# Patient Record
Sex: Male | Born: 1990 | Race: White | Hispanic: No | Marital: Married | Smoking: Never smoker
Health system: Southern US, Community
[De-identification: ages and names within clinical notes are randomized; demographics above are authoritative.]

## PROBLEM LIST (undated history)

## (undated) DIAGNOSIS — C9590 Leukemia, unspecified not having achieved remission: Secondary | ICD-10-CM

## (undated) HISTORY — PX: BACK SURGERY: SHX140

---
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
## 2011-01-16 ENCOUNTER — Emergency Department: Payer: Self-pay | Admitting: Unknown Physician Specialty

## 2011-09-11 ENCOUNTER — Emergency Department: Payer: Self-pay | Admitting: Emergency Medicine

## 2012-03-04 ENCOUNTER — Emergency Department: Payer: Self-pay | Admitting: Internal Medicine

## 2013-11-27 ENCOUNTER — Ambulatory Visit: Payer: Self-pay | Admitting: Emergency Medicine

## 2014-02-18 ENCOUNTER — Ambulatory Visit: Payer: Self-pay

## 2014-02-25 ENCOUNTER — Ambulatory Visit: Payer: Self-pay

## 2014-11-05 ENCOUNTER — Emergency Department: Payer: Self-pay | Admitting: Emergency Medicine

## 2014-12-02 ENCOUNTER — Ambulatory Visit: Payer: Self-pay | Admitting: Family Medicine

## 2015-01-22 ENCOUNTER — Ambulatory Visit: Payer: BLUE CROSS/BLUE SHIELD

## 2015-01-22 ENCOUNTER — Ambulatory Visit
Admission: EM | Admit: 2015-01-22 | Discharge: 2015-01-22 | Disposition: A | Discharge status: Home / Self Care | Payer: BLUE CROSS/BLUE SHIELD | Attending: Family Medicine | Admitting: Family Medicine

## 2015-01-22 DIAGNOSIS — X58XXXA Exposure to other specified factors, initial encounter: Secondary | ICD-10-CM | POA: Diagnosis not present

## 2015-01-22 DIAGNOSIS — M25579 Pain in unspecified ankle and joints of unspecified foot: Secondary | ICD-10-CM | POA: Diagnosis present

## 2015-01-22 DIAGNOSIS — S93402A Sprain of unspecified ligament of left ankle, initial encounter: Secondary | ICD-10-CM | POA: Diagnosis not present

## 2015-01-22 HISTORY — DX: Leukemia, unspecified not having achieved remission: C95.90

## 2015-01-22 MED ORDER — KETOROLAC TROMETHAMINE 60 MG/2ML IM SOLN
60.0000 mg | Freq: Once | INTRAMUSCULAR | Status: AC
  Administered 2015-01-22: 60 mg via INTRAMUSCULAR

## 2015-01-22 MED ORDER — ETODOLAC 500 MG PO TABS: 500.0000 mg | ORAL_TABLET | Freq: Two times a day (BID) | ORAL | Status: DC

## 2015-01-22 NOTE — ED Provider Notes (Signed)
CSN: [id number]     Arrival date & time 01/22/15  1346 History   First MD Initiated Contact with Patient 01/22/15 1456     Chief Complaint  Patient presents with  . Ankle Pain   (Consider location/radiation/quality/duration/timing/severity/associated sxs/prior Treatment) HPI       24 year old male presents for evaluation of foot and ankle pain. He was working in his yard on Monday when he stepped in a hole and rolled his foot and ankle over in an inversion type injury. He felt a pop around the lateral portion of his foot. He had some bruising and swelling, the swelling is mostly resolved. He has some pain immediately with any ambulation that worsens with continued ambulation. He denies any previous history of ankle sprains or lower leg fractures. No other past medical history. No recent travel or sick contacts. No numbness. No other injuries  Past Medical History  Diagnosis Date  . 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 No pertinent past surgical history. History reviewed. No pertinent family history. History  Substance Use Topics  . Smoking status: Never Smoker   . Smokeless tobacco: Not on file  . Alcohol Use: Yes     Comment: socially    Review of Systems  Musculoskeletal: Positive for joint swelling and arthralgias.       Left foot and ankle pain and swelling  All other systems reviewed and are negative.   Allergies  Review of patient's allergies indicates no known allergies.  Home Medications   Prior to Admission medications   Medication Sig Start Date End Date Taking? Authorizing Provider  ibuprofen (ADVIL,MOTRIN) 800 MG tablet Take 1,600 mg by mouth every 8 (eight) hours as needed.   Yes Historical Provider, MD  etodolac (LODINE) 500 MG tablet Take 1 tablet (500 mg total) by mouth 2 (two) times daily. 01/22/15   Freeman Caldron Kebron Pulse, PA-C   BP 152/98 mmHg  Pulse 100  Temp(Src) 96.3 F (35.7 C) (Tympanic)  Resp 16  Ht 6\' 4"  (1.93 m)  Wt 254 lb  (115.214 kg)  BMI 30.93 kg/m2  SpO2 98% Physical Exam  Constitutional: He is oriented to person, place, and time. He appears well-developed and well-nourished. No distress.  HENT:  Head: Normocephalic.  Cardiovascular:  Pulses:      Dorsalis pedis pulses are 2+ on the left side.  Pulmonary/Chest: Effort normal. No respiratory distress.  Musculoskeletal:       Left ankle: He exhibits normal range of motion, no swelling and no deformity. Tenderness. Lateral malleolus and medial malleolus tenderness found.       Left foot: There is tenderness (lateral foot and proximal dorsal portion of the foot towards the ankle), bony tenderness and swelling. There is normal range of motion, no crepitus and no deformity.  He endorses pain and tenderness everywhere, including the Achilles tendon, medial and lateral joint lines, proximal portions of foot, heel, through the arch of the foot. Objectively, the foot and ankle appear completely normal  Neurological: He is alert and oriented to person, place, and time. Coordination normal.  Skin: Skin is warm and dry. No rash noted. He is not diaphoretic.  Psychiatric: He has a normal mood and affect. Judgment normal.  Nursing note and vitals reviewed.   ED Course  Procedures (including critical care time) Labs Review Labs Reviewed - No data to display  Imaging Review Dg Ankle Complete Left  01/22/2015   CLINICAL DATA:  Ankle pain  EXAM:  LEFT ANKLE COMPLETE - 3+ VIEW  COMPARISON:  None.  FINDINGS: There is no evidence of fracture, dislocation, or joint effusion. There is no evidence of arthropathy or other focal bone abnormality. Soft tissues are unremarkable.  IMPRESSION: Negative.   Electronically Signed   By: Franchot Gallo M.D.   On: 01/22/2015 16:21   Dg Foot Complete Left  01/22/2015   CLINICAL DATA:  Acute left foot pain after injury in yard. Initial encounter.  EXAM: LEFT FOOT - COMPLETE 3+ VIEW  COMPARISON:  None.  FINDINGS: There is no evidence of  fracture or dislocation. There is no evidence of arthropathy or other focal bone abnormality. Soft tissues are unremarkable.  IMPRESSION: Normal left foot.   Electronically Signed   By: Marijo Conception, M.D.   On: 01/22/2015 15:14     MDM   1. Ankle sprain, left, initial encounter    Most likely an ankle sprain. He was placed in a stirrup ankle splint. Advised ice, elevation. Will prescribe an anti-inflammatory. Follow-up if no improvement in a week to repeat the x-rays. We discussed rehabilitation exercises as well.  Meds ordered this encounter  Medications         . ketorolac (TORADOL) injection 60 mg    Sig:   . etodolac (LODINE) 500 MG tablet    Sig: Take 1 tablet (500 mg total) by mouth 2 (two) times daily.    Dispense:  30 tablet    Refill:  [place] Marcy Bogosian, PA-C 01/22/15 1731  Liam Graham, PA-C 01/22/15 1731

## 2015-01-22 NOTE — Discharge Instructions (Signed)
Acute Ankle Sprain with Phase I Rehab An acute ankle sprain is a partial or complete tear in one or more of the ligaments of the ankle due to traumatic injury. The severity of the injury depends on both the number of ligaments sprained and the grade of sprain. There are 3 grades of sprains.   A grade 1 sprain is a mild sprain. There is a slight pull without obvious tearing. There is no loss of strength, and the muscle and ligament are the correct length.  A grade 2 sprain is a moderate sprain. There is tearing of fibers within the substance of the ligament where it connects two bones or two cartilages. The length of the ligament is increased, and there is usually decreased strength.  A grade 3 sprain is a complete rupture of the ligament and is uncommon. In addition to the grade of sprain, there are three types of ankle sprains.  Lateral ankle sprains: This is a sprain of one or more of the three ligaments on the outer side (lateral) of the ankle. These are the most common sprains. Medial ankle sprains: There is one large triangular ligament of the inner side (medial) of the ankle that is susceptible to injury. Medial ankle sprains are less common. Syndesmosis, "high ankle," sprains: The syndesmosis is the ligament that connects the two bones of the lower leg. Syndesmosis sprains usually only occur with very severe ankle sprains. SYMPTOMS  Pain, tenderness, and swelling in the ankle, starting at the side of injury that may progress to the whole ankle and foot with time.  "Pop" or tearing sensation at the time of injury.  Bruising that may spread to the heel.  Impaired ability to walk soon after injury. CAUSES   Acute ankle sprains are caused by trauma placed on the ankle that temporarily forces or pries the anklebone (talus) out of its normal socket.  Stretching or tearing of the ligaments that normally hold the joint in place (usually due to a twisting injury). RISK INCREASES  WITH:  Previous ankle sprain.  Sports in which the foot may land awkwardly (i.e., basketball, volleyball, or soccer) or walking or running on uneven or rough surfaces.  Shoes with inadequate support to prevent sideways motion when stress occurs.  Poor strength and flexibility.  Poor balance skills.  Contact sports. PREVENTION   Warm up and stretch properly before activity.  Maintain physical fitness:  Ankle and leg flexibility, muscle strength, and endurance.  Cardiovascular fitness.  Balance training activities.  Use proper technique and have a coach correct improper technique.  Taping, protective strapping, bracing, or high-top tennis shoes may help prevent injury. Initially, tape is best; however, it loses most of its support function within 10 to 15 minutes.  Wear proper-fitted protective shoes (High-top shoes with taping or bracing is more effective than either alone).  Provide the ankle with support during sports and practice activities for 12 months following injury. PROGNOSIS   If treated properly, ankle sprains can be expected to recover completely; however, the length of recovery depends on the degree of injury.  A grade 1 sprain usually heals enough in 5 to 7 days to allow modified activity and requires an average of 6 weeks to heal completely.  A grade 2 sprain requires 6 to 10 weeks to heal completely.  A grade 3 sprain requires 12 to 16 weeks to heal.  A syndesmosis sprain often takes more than 3 months to heal. RELATED COMPLICATIONS   Frequent recurrence of symptoms may  result in a chronic problem. Appropriately addressing the problem the first time decreases the frequency of recurrence and optimizes healing time. Severity of the initial sprain does not predict the likelihood of later instability.  Injury to other structures (bone, cartilage, or tendon).  A chronically unstable or arthritic ankle joint is a possibility with repeated  sprains. TREATMENT Treatment initially involves the use of ice, medication, and compression bandages to help reduce pain and inflammation. Ankle sprains are usually immobilized in a walking cast or boot to allow for healing. Crutches may be recommended to reduce pressure on the injury. After immobilization, strengthening and stretching exercises may be necessary to regain strength and a full range of motion. Surgery is rarely needed to treat ankle sprains. MEDICATION   Nonsteroidal anti-inflammatory medications, such as aspirin and ibuprofen (do not take for the first 3 days after injury or within 7 days before surgery), or other minor pain relievers, such as acetaminophen, are often recommended. Take these as directed by your caregiver. Contact your caregiver immediately if any bleeding, stomach upset, or signs of an allergic reaction occur from these medications.  Ointments applied to the skin may be helpful.  Pain relievers may be prescribed as necessary by your caregiver. Do not take prescription pain medication for longer than 4 to 7 days. Use only as directed and only as much as you need. HEAT AND COLD  Cold treatment (icing) is used to relieve pain and reduce inflammation for acute and chronic cases. Cold should be applied for 10 to 15 minutes every 2 to 3 hours for inflammation and pain and immediately after any activity that aggravates your symptoms. Use ice packs or an ice massage.  Heat treatment may be used before performing stretching and strengthening activities prescribed by your caregiver. Use a heat pack or a warm soak. SEEK IMMEDIATE MEDICAL CARE IF:   Pain, swelling, or bruising worsens despite treatment.  You experience pain, numbness, discoloration, or coldness in the foot or toes.  New, unexplained symptoms develop (drugs used in treatment may produce side effects.) EXERCISES  PHASE I EXERCISES RANGE OF MOTION (ROM) AND STRETCHING EXERCISES - Ankle Sprain, Acute Phase I,  Weeks 1 to 2 These exercises may help you when beginning to restore flexibility in your ankle. You will likely work on these exercises for the 1 to 2 weeks after your injury. Once your physician, physical therapist, or athletic trainer sees adequate progress, he or she will advance your exercises. While completing these exercises, remember:   Restoring tissue flexibility helps normal motion to return to the joints. This allows healthier, less painful movement and activity.  An effective stretch should be held for at least 30 seconds.  A stretch should never be painful. You should only feel a gentle lengthening or release in the stretched tissue. RANGE OF MOTION - Dorsi/Plantar Flexion  While sitting with your right / left knee straight, draw the top of your foot upwards by flexing your ankle. Then reverse the motion, pointing your toes downward.  Hold each position for __________ seconds.  After completing your first set of exercises, repeat this exercise with your knee bent. Repeat __________ times. Complete this exercise __________ times per day.  RANGE OF MOTION - Ankle Alphabet  Imagine your right / left big toe is a pen.  Keeping your hip and knee still, write out the entire alphabet with your "pen." Make the letters as large as you can without increasing any discomfort. Repeat __________ times. Complete this exercise __________  times per day.  STRENGTHENING EXERCISES - Ankle Sprain, Acute -Phase I, Weeks 1 to 2 These exercises may help you when beginning to restore strength in your ankle. You will likely work on these exercises for 1 to 2 weeks after your injury. Once your physician, physical therapist, or athletic trainer sees adequate progress, he or she will advance your exercises. While completing these exercises, remember:   Muscles can gain both the endurance and the strength needed for everyday activities through controlled exercises.  Complete these exercises as instructed by  your physician, physical therapist, or athletic trainer. Progress the resistance and repetitions only as guided.  You may experience muscle soreness or fatigue, but the pain or discomfort you are trying to eliminate should never worsen during these exercises. If this pain does worsen, stop and make certain you are following the directions exactly. If the pain is still present after adjustments, discontinue the exercise until you can discuss the trouble with your clinician. STRENGTH - Dorsiflexors  Secure a rubber exercise band/tubing to a fixed object (i.e., table, pole) and loop the other end around your right / left foot.  Sit on the floor facing the fixed object. The band/tubing should be slightly tense when your foot is relaxed.  Slowly draw your foot back toward you using your ankle and toes.  Hold this position for __________ seconds. Slowly release the tension in the band and return your foot to the starting position. Repeat __________ times. Complete this exercise __________ times per day.  STRENGTH - Plantar-flexors   Sit with your right / left leg extended. Holding onto both ends of a rubber exercise band/tubing, loop it around the ball of your foot. Keep a slight tension in the band.  Slowly push your toes away from you, pointing them downward.  Hold this position for __________ seconds. Return slowly, controlling the tension in the band/tubing. Repeat __________ times. Complete this exercise __________ times per day.  STRENGTH - Ankle Eversion  Secure one end of a rubber exercise band/tubing to a fixed object (table, pole). Loop the other end around your foot just before your toes.  Place your fists between your knees. This will focus your strengthening at your ankle.  Drawing the band/tubing across your opposite foot, slowly, pull your little toe out and up. Make sure the band/tubing is positioned to resist the entire motion.  Hold this position for __________ seconds. Have  your muscles resist the band/tubing as it slowly pulls your foot back to the starting position.  Repeat __________ times. Complete this exercise __________ times per day.  STRENGTH - Ankle Inversion  Secure one end of a rubber exercise band/tubing to a fixed object (table, pole). Loop the other end around your foot just before your toes.  Place your fists between your knees. This will focus your strengthening at your ankle.  Slowly, pull your big toe up and in, making sure the band/tubing is positioned to resist the entire motion.  Hold this position for __________ seconds.  Have your muscles resist the band/tubing as it slowly pulls your foot back to the starting position. Repeat __________ times. Complete this exercises __________ times per day.  STRENGTH - Towel Curls  Sit in a chair positioned on a non-carpeted surface.  Place your right / left foot on a towel, keeping your heel on the floor.  Pull the towel toward your heel by only curling your toes. Keep your heel on the floor.  If instructed by your physician, physical therapist,   or athletic trainer, add weight to the end of the towel. Repeat __________ times. Complete this exercise __________ times per day. Document Released: 03/31/2005 Document Revised: 01/14/2014 Document Reviewed: 12/12/2008 [facility] Patient Information 2015 [place], [place]. This information is not intended to replace advice given to you by your health care provider. Make sure you discuss any questions you have with your health care provider.  Acute Ankle Sprain with Phase II Rehab An acute ankle sprain is a partial or complete tear in one or more of the ligaments of the ankle due to traumatic injury. The severity of the injury depends on both the number of ligaments sprained and the grade of sprain. There are 3 grades of sprains.  A grade 1 sprain is a mild sprain. There is a slight pull without obvious tearing. There is no loss of strength, and the muscle  and ligament are the correct length.  A grade 2 sprain is a moderate sprain. There is tearing of fibers within the substance of the ligament where it connects two bones or two cartilages. The length of the ligament is increased, and there is usually decreased strength.  A grade 3 sprain is a complete rupture of the ligament and is uncommon. In addition to the grade of sprain, there are 3 types of ankle sprains.  Lateral ankle sprains. This is a sprain of one or more of the 3 ligaments on the outer side (lateral) of the ankle. These are the most common sprains. Medial ankle sprains. There is one large triangular ligament on the inner side (medial) of the ankle that is susceptible to injury. Medial ankle sprains are less common. Syndesmosis, "high ankle," sprains. The syndesmosis is the ligament that connects the two bones of the lower leg. Syndesmosis sprains usually only occur with very severe ankle sprains. SYMPTOMS  Pain, tenderness, and swelling in the ankle, starting at the side of injury that may progress to the whole ankle and foot with time.  "Pop" or tearing sensation at the time of injury.  Bruising that may spread to the heel.  Impaired ability to walk soon after injury. CAUSES   Acute ankle sprains are caused by trauma placed on the ankle that temporarily forces or pries the anklebone (talus) out of its normal socket.  Stretching or tearing of the ligaments that normally hold the joint in place (usually due to a twisting injury). RISK INCREASES WITH:  Previous ankle sprain.  Sports in which the foot may land awkwardly (basketball, volleyball, soccer) or walking or running on uneven or rough surfaces.  Shoes with inadequate support to prevent sideways motion when stress occurs.  Poor strength and flexibility.  Poor balance skills.  Contact sports. PREVENTION  Warm up and stretch properly before activity.  Maintain physical fitness:  Ankle and leg flexibility,  muscle strength, and endurance.  Cardiovascular fitness.  Balance training activities.  Use proper technique and have a coach correct improper technique.  Taping, protective strapping, bracing, or high-top tennis shoes may help prevent injury. Initially, tape is best. However, it loses most of its support function within 10 to 15 minutes.  Wear proper fitted protective shoes. Combining high-top shoes with taping or bracing is more effective than using either alone.  Provide the ankle with support during sports and practice activities for 12 months following injury. PROGNOSIS   If treated properly, ankle sprains can be expected to recover completely. However, the length of recovery depends on the degree of injury.  A grade 1 sprain usually heals  enough in 5 to 7 days to allow modified activity and requires an average of 6 weeks to heal completely.  A grade 2 sprain requires 6 to 10 weeks to heal completely.  A grade 3 sprain requires 12 to 16 weeks to heal.  A syndesmosis sprain often takes more than 3 months to heal. RELATED COMPLICATIONS   Frequent recurrence of symptoms may result in a chronic problem. Appropriately addressing the problem the first time decreases the frequency of recurrence and optimizes healing time. Severity of initial sprain does not predict the likelihood of later instability.  Injury to other structures (bone, cartilage, or tendon).  Chronically unstable or arthritic ankle joint are possible with repeated sprains. TREATMENT Treatment initially involves the use of ice, medicine, and compression bandages to help reduce pain and inflammation. Ankle sprains are usually immobilized in a walking cast or boot to allow for healing. Crutches may be recommended to reduce pressure on the injury. After immobilization, strengthening and stretching exercises may be necessary to regain strength and a full range of motion. Surgery is rarely needed to treat ankle  sprains. MEDICATION   Nonsteroidal anti-inflammatory medicines, such as aspirin and ibuprofen (do not take for the first 3 days after injury or within 7 days before surgery), or other minor pain relievers, such as acetaminophen, are often recommended. Take these as directed by your caregiver. Contact your caregiver immediately if any bleeding, stomach upset, or signs of an allergic reaction occur from these medicines.  Ointments applied to the skin may be helpful.  Pain relievers may be prescribed as necessary by your caregiver. Do not take prescription pain medicine for longer than 4 to 7 days. Use only as directed and only as much as you need. HEAT AND COLD  Cold treatment (icing) is used to relieve pain and reduce inflammation for acute and chronic cases. Cold should be applied for 10 to 15 minutes every 2 to 3 hours for inflammation and pain and immediately after any activity that aggravates your symptoms. Use ice packs or an ice massage.  Heat treatment may be used before performing stretching and strengthening activities prescribed by your caregiver. Use a heat pack or a warm soak. SEEK IMMEDIATE MEDICAL CARE IF:   Pain, swelling, or bruising worsens despite treatment.  You experience pain, numbness, discoloration, or coldness in the foot or toes.  New, unexplained symptoms develop. (Drugs used in treatment may produce side effects.) EXERCISES  PHASE II EXERCISES RANGE OF MOTION (ROM) AND STRETCHING EXERCISES - Ankle Sprain, Acute-Phase II, Weeks 3 to 4 After your physician, physical therapist, or athletic trainer feels your knee has made progress significant enough to begin more advanced exercises, he or she may recommend completing some of the following exercises. Although each person heals at different rates, most people will be ready for these exercises between 3 and 4 weeks after their injury. Do not begin these exercises until you have your caregiver's permission. He or she may  also advise you to continue with the exercises which you completed in Phase I of your rehabilitation. While completing these exercises, remember:   Restoring tissue flexibility helps normal motion to return to the joints. This allows healthier, less painful movement and activity.  An effective stretch should be held for at least 30 seconds.  A stretch should never be painful. You should only feel a gentle lengthening or release in the stretched tissue. RANGE OF MOTION - Ankle Plantar Flexion   Sit with your right / left leg crossed  over your opposite knee.  Use your opposite hand to pull the top of your foot and toes toward you.  You should feel a gentle stretch on the top of your foot/ankle. Hold this position for __________. Repeat __________ times. Complete __________ times per day.  RANGE OF MOTION - Ankle Eversion  Sit with your right / left ankle crossed over your opposite knee.  Grip your foot with your opposite hand, placing your thumb on the top of your foot and your fingers across the bottom of your foot.  Gently push your foot downward with a slight rotation so your littlest toes rise slightly  You should feel a gentle stretch on the inside of your ankle. Hold the stretch for __________ seconds. Repeat __________ times. Complete this exercise __________ times per day.  RANGE OF MOTION - Ankle Inversion  Sit with your right / left ankle crossed over your opposite knee.  Grip your foot with your opposite hand, placing your thumb on the bottom of your foot and your fingers across the top of your foot.  Gently pull your foot so the smallest toe comes toward you and your thumb pushes the inside of the ball of your foot away from you.  You should feel a gentle stretch on the outside of your ankle. Hold the stretch for __________ seconds. Repeat __________ times. Complete this exercise __________ times per day.  STRETCH - Gastrocsoleus  Sit with your right / left leg  extended. Holding onto both ends of a belt or towel, loop it around the ball of your foot.  Keeping your right / left ankle and foot relaxed and your knee straight, pull your foot and ankle toward you using the belt/towel.  You should feel a gentle stretch behind your calf or knee. Hold this position for __________ seconds. Repeat __________ times. Complete this stretch __________ times per day.  RANGE OF MOTION - Ankle Dorsiflexion, Active Assisted  Remove shoes and sit on a chair that is preferably not on a carpeted surface.  Place right / left foot under knee. Extend your opposite leg for support.  Keeping your heel down, slide your right / left foot back toward the chair until you feel a stretch at your ankle or calf. If you do not feel a stretch, slide your bottom forward to the edge of the chair while still keeping your heel down.  Hold this stretch for __________ seconds. Repeat __________ times. Complete this stretch __________ times per day.  STRETCH - Gastroc, Standing   Place hands on wall.  Extend right / left leg and place a folded washcloth under the arch of your foot for support. Keep the front knee somewhat bent.  Slightly point your toes inward on your back foot.  Keeping your right / left heel on the floor and your knee straight, shift your weight toward the wall, not allowing your back to arch.  You should feel a gentle stretch in the calf. Hold this position for __________ seconds. Repeat __________ times. Complete this stretch __________ times per day. STRETCH - Soleus, Standing  Place hands on wall.  Extend right / left leg and place a folded washcloth under the arch of your foot for support. Keep the front knee somewhat bent.  Slightly point your toes inward on your back foot.  Keep your right / left heel on the floor, bend your back knee, and slightly shift your weight over the back leg so that you feel a gentle stretch deep in  your back calf.  Hold this  position for __________ seconds. Repeat __________ times. Complete this stretch __________ times per day. STRETCH - Gastrocsoleus, Standing Note: This exercise can place a lot of stress on your foot and ankle. Please complete this exercise only if specifically instructed by your caregiver.   Place the ball of your right / left foot on a step, keeping your other foot firmly on the same step.  Hold on to the wall or a rail for balance.  Slowly lift your other foot, allowing your body weight to press your heel down over the edge of the step.  You should feel a stretch in your right / left calf.  Hold this position for __________ seconds.  Repeat this exercise with a slight bend in your knee. Repeat __________ times. Complete this stretch __________ times per day.  STRENGTHENING EXERCISES - Ankle Sprain, Acute-Phase II Around 3 to 4 weeks after your injury, you may progress to some of these exercises in your rehabilitation program. Do not begin these until you have your caregiver's permission. Although your condition has improved, the Phase I exercises will continue to be helpful and you may continue to complete them. As you complete strengthening exercises, remember:   Strong muscles with good endurance tolerate stress better.  Do the exercises as initially prescribed by your caregiver. Progress slowly with each exercise, gradually increasing the number of repetitions and weight used under his or her guidance.  You may experience muscle soreness or fatigue, but the pain or discomfort you are trying to eliminate should never worsen during these exercises. If this pain does worsen, stop and make certain you are following the directions exactly. If the pain is still present after adjustments, discontinue the exercise until you can discuss the trouble with your caregiver. STRENGTH - Plantar-flexors, Standing  Stand with your feet shoulder width apart. Steady yourself with a wall or table using as  little support as needed.  Keeping your weight evenly spread over the width of your feet, rise up on your toes.*  Hold this position for __________ seconds. Repeat __________ times. Complete this exercise __________ times per day.  *If this is too easy, shift your weight toward your right / left leg until you feel challenged. Ultimately, you may be asked to do this exercise with your right / left foot only. STRENGTH - Dorsiflexors and Plantar-flexors, Heel/toe Walking  Dorsiflexion: Walk on your heels only. Keep your toes as high as possible.  Walk for ____________________ seconds/feet.  Repeat __________ times. Complete __________ times per day.  Plantar flexion: Walk on your toes only. Keep your heels as high as possible.  Walk for ____________________ seconds/feet. Repeat __________ times. Complete __________ times per day.  BALANCE - Tandem Walking  Place your uninjured foot on a line 2 to 4 inches wide and at least 10 feet long.  Keeping your balance without using anything for extra support, place your right / left heel directly in front of your other foot.  Slowly raise your back foot up, lifting from the heel to the toes, and place it directly in front of the right / left foot.  Continue to walk along the line slowly. Walk for ____________________ feet. Repeat ____________________ times. Complete ____________________ times per day. BALANCE - Inversion/Eversion Use caution, these are advanced level exercises. Do not begin them until you are advised to do so.   Create a balance board using a sturdy board about 1  feet long and at 1 to 1  feet wide and a 1  inch diameter rod or pipe that is as long as the board's width. A copper pipe or a solid broomstick work well.  Stand on a non-carpeted surface near a countertop or wall. Step onto the board so that your feet are hip-width apart and equally straddle the rod/pipe.  Keeping your feet in place, complete these two exercises  without shifting your upper body or hips:  Tip the board from side-to-side. Control the movement so the board does not forcefully strike the ground. The board should silently tap the ground.  Tip the board side-to-side without striking the ground. Occasionally pause and maintain a steady position at various points.  Repeat the first two exercises, but use only your right / left foot. Place your right / left foot directly over the rod/pipe. Repeat __________ times. Complete this exercise __________ times a day. BALANCE - Plantar/Dorsi Flexion Use caution, these are advanced level exercises. Do not begin them until you are advised to do so.   Create a balance board using a sturdy board about 1  feet long and at 1 to 1  feet wide and a 1  inch diameter rod or pipe that is as long as the board's width. A copper pipe or a solid broomstick work well.  Stand on a non-carpeted surface near a countertop or wall. Stand on the board so that the rod/pipe runs under the arches in your feet.  Keeping your feet in place, complete these two exercises without shifting your upper body or hips:  Tip the board from side-to-side. Control the movement so the board does not forcefully strike the ground. The board should silently tap the ground.  Tip the board side-to-side without striking the ground. Occasionally pause and maintain a steady position at various points.  Repeat the first two exercises, but use only your right / left foot. Stand in the center of the board. Repeat __________ times. Complete this exercise __________ times a day. STRENGTH - Plantar-flexors, Eccentric Note: This exercise can place a lot of stress on your foot and ankle. Please complete this exercise only if specifically instructed by your caregiver.   Place the balls of your feet on a step. With your hands, use only enough support from a wall or rail to keep your balance.  Keep your knees straight and rise up on your  toes.  Slowly shift your weight entirely to your toes and pick up your opposite foot. Gently and with controlled movement, lower your weight through your right / left foot so that your heel drops below the level of the step. You will feel a slight stretch in the back of your calf at the ending position.  Use the healthy leg to help rise up onto the balls of both feet, then lower weight only on the right / left leg again. Build up to 15 repetitions. Then progress to 3 consecutive sets of 15 repetitions.*  After completing the above exercise, complete the same exercise with a slight knee bend (about 30 degrees). Again, build up to 15 repetitions. Then progress to 3 consecutive sets of 15 repetitions.* Perform this exercise __________ times per day.  *When you easily complete 3 sets of 15, your physician, physical therapist, or athletic trainer may advise you to add resistance by wearing a backpack filled with additional weight. Document Released: 12/20/2005 Document Revised: 11/22/2011 Document Reviewed: 12/12/2008 [facility] Patient Information 2015 [place], [place]. This information is not intended to replace advice given to you  by your health care provider. Make sure you discuss any questions you have with your health care provider. ° °

## 2015-01-22 NOTE — ED Notes (Signed)
Walking in yard Monday night and rolled left foot in a hole. Heard a pop and now has pain and swelling dorsal left foot near ankle

## 2015-04-08 ENCOUNTER — Ambulatory Visit
Admission: EM | Admit: 2015-04-08 | Discharge: 2015-04-08 | Disposition: A | Discharge status: Home / Self Care | Payer: BLUE CROSS/BLUE SHIELD | Attending: Internal Medicine | Admitting: Internal Medicine

## 2015-04-08 DIAGNOSIS — S39012A Strain of muscle, fascia and tendon of lower back, initial encounter: Secondary | ICD-10-CM

## 2015-04-08 DIAGNOSIS — M5432 Sciatica, left side: Secondary | ICD-10-CM | POA: Diagnosis not present

## 2015-04-08 MED ORDER — MELOXICAM 15 MG PO TABS: 15.0000 mg | ORAL_TABLET | Freq: Every day | ORAL | Status: DC

## 2015-04-08 MED ORDER — CYCLOBENZAPRINE HCL 5 MG PO TABS: 10.0000 mg | ORAL_TABLET | Freq: Three times a day (TID) | ORAL | Status: DC | PRN
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
## 2015-04-08 NOTE — Discharge Instructions (Signed)
Prescriptions for mobic (meloxicam, anti inflammatory) and flexeril (cyclobenzaprine, muscle relaxer) were sent to the [facility]. Note for work for tomorrow, Thursday; return to work on Friday 04/11/15  Back Pain, Adult Back pain is very common. The pain often gets better over time. The cause of back pain is usually not dangerous. Most people can learn to manage their back pain on their own.  HOME CARE   Stay active. Start with short walks on flat ground if you can. Try to walk farther each day.  Do not sit, drive, or stand in one place for more than 30 minutes. Do not stay in bed.  Do not avoid exercise or work. Activity can help your back heal faster.  Be careful when you bend or lift an object. Bend at your knees, keep the object close to you, and do not twist.  Sleep on a firm mattress. Lie on your side, and bend your knees. If you lie on your back, put a pillow under your knees.  Only take medicines as told by your doctor.  Put ice on the injured area.  Put ice in a plastic bag.  Place a towel between your skin and the bag.  Leave the ice on for 15-20 minutes, 03-04 times a day for the first 2 to 3 days. After that, you can switch between ice and heat packs.  Ask your doctor about back exercises or massage.  Avoid feeling anxious or stressed. Find good ways to deal with stress, such as exercise. GET HELP RIGHT AWAY IF:   Your pain does not go away with rest or medicine.  Your pain does not go away in 1 week.  You have new problems.  You do not feel well.  The pain spreads into your legs.  You cannot control when you poop (bowel movement) or pee (urinate).  Your arms or legs feel weak or lose feeling (numbness).  You feel sick to your stomach (nauseous) or throw up (vomit).  You have belly (abdominal) pain.  You feel like you may pass out (faint). MAKE SURE YOU:   Understand these instructions.  Will watch your condition.  Will get help right away if  you are not doing well or get worse. Document Released: 02/16/2008 Document Revised: 11/22/2011 Document Reviewed: 01/01/2014 [facility] Patient Information 2015 [place], [place]. This information is not intended to replace advice given to you by your health care provider. Make sure you discuss any questions you have with your health care provider.

## 2015-11-06 DIAGNOSIS — Z791 Long term (current) use of non-steroidal anti-inflammatories (NSAID): Secondary | ICD-10-CM | POA: Diagnosis not present

## 2015-11-06 DIAGNOSIS — R1084 Generalized abdominal pain: Secondary | ICD-10-CM | POA: Diagnosis not present

## 2015-11-06 DIAGNOSIS — R112 Nausea with vomiting, unspecified: Secondary | ICD-10-CM | POA: Insufficient documentation

## 2015-11-07 ENCOUNTER — Emergency Department: Payer: BLUE CROSS/BLUE SHIELD

## 2015-11-07 ENCOUNTER — Emergency Department
Admission: EM | Admit: 2015-11-07 | Discharge: 2015-11-07 | Disposition: A | Discharge status: Home / Self Care | Payer: BLUE CROSS/BLUE SHIELD | Attending: Emergency Medicine | Admitting: Emergency Medicine

## 2015-11-07 DIAGNOSIS — R112 Nausea with vomiting, unspecified: Secondary | ICD-10-CM

## 2015-11-07 DIAGNOSIS — R1084 Generalized abdominal pain: Secondary | ICD-10-CM

## 2015-11-07 LAB — CBC
HCT: 41.9 % (ref 40.0–52.0)
Hemoglobin: 14.5 g/dL (ref 13.0–18.0)
MCH: 29.4 pg (ref 26.0–34.0)
MCHC: 34.6 g/dL (ref 32.0–36.0)
MCV: 85 fL (ref 80.0–100.0)
PLATELETS: 304 10*3/uL (ref 150–440)
RBC: 4.93 MIL/uL (ref 4.40–5.90)
RDW: 13 % (ref 11.5–14.5)
WBC: 11.3 10*3/uL — ABNORMAL HIGH (ref 3.8–10.6)

## 2015-11-07 LAB — COMPREHENSIVE METABOLIC PANEL
ALT: 64 U/L — AB (ref 17–63)
AST: 45 U/L — ABNORMAL HIGH (ref 15–41)
Albumin: 4.1 g/dL (ref 3.5–5.0)
Alkaline Phosphatase: 53 U/L (ref 38–126)
Anion gap: 7 (ref 5–15)
BUN: 15 mg/dL (ref 6–20)
CALCIUM: 8.7 mg/dL — AB (ref 8.9–10.3)
CO2: 25 mmol/L (ref 22–32)
CREATININE: 0.92 mg/dL (ref 0.61–1.24)
Chloride: 108 mmol/L (ref 101–111)
GFR calc Af Amer: >60 mL/min (ref >60)
Glucose, Bld: 95 mg/dL (ref 65–99)
Potassium: 3.7 mmol/L (ref 3.5–5.1)
SODIUM: 140 mmol/L (ref 135–145)
Total Bilirubin: 1.3 mg/dL — ABNORMAL HIGH (ref 0.3–1.2)
Total Protein: 7.4 g/dL (ref 6.5–8.1)

## 2015-11-07 LAB — LIPASE, BLOOD: Lipase: 24 U/L (ref 11–51)

## 2015-11-07 MED ORDER — SODIUM CHLORIDE 0.9 % IV BOLUS (SEPSIS)
1000.0000 mL | Freq: Once | INTRAVENOUS | Status: AC
Start: 2015-11-07 — End: 2015-11-07
  Administered 2015-11-07: 1000 mL via INTRAVENOUS

## 2015-11-07 MED ORDER — ONDANSETRON HCL 4 MG/2ML IJ SOLN
INTRAMUSCULAR | Status: AC
  Administered 2015-11-07: 4 mg via INTRAVENOUS
  Filled 2015-11-07: qty 2

## 2015-11-07 MED ORDER — IOHEXOL 240 MG/ML SOLN
25.0000 mL | Freq: Once | INTRAMUSCULAR | Status: AC | PRN
  Administered 2015-11-07: 25 mL via ORAL

## 2015-11-07 MED ORDER — ONDANSETRON HCL 4 MG/2ML IJ SOLN
4.0000 mg | Freq: Once | INTRAMUSCULAR | Status: AC
  Administered 2015-11-07: 4 mg via INTRAVENOUS

## 2015-11-07 MED ORDER — IOHEXOL 300 MG/ML  SOLN
125.0000 mL | Freq: Once | INTRAMUSCULAR | Status: AC | PRN
  Administered 2015-11-07: 125 mL via INTRAVENOUS

## 2015-11-07 MED ORDER — ONDANSETRON HCL 4 MG PO TABS: 4.0000 mg | ORAL_TABLET | Freq: Every day | ORAL | Status: DC | PRN

## 2015-11-07 NOTE — ED Notes (Signed)
Pt discharged to home.  Discharge instructions reviewed.  Verbalized understanding.  No questions or concerns at this time.  Teach back verified.  Pt in NAD.  No items left in ED.   

## 2015-11-07 NOTE — ED Notes (Signed)
Patient with onset of nausea/vomiting yesterday. Unable to keep anything down today. Denies diarrhea. States body aches and fever to 101.0. Last dose of Tylenol around 1630

## 2015-11-07 NOTE — Discharge Instructions (Signed)
Abdominal Pain, Adult Many things can cause abdominal pain. Usually, abdominal pain is not caused by a disease and will improve without treatment. It can often be observed and treated at home. Your health care provider will do a physical exam and possibly order blood tests and X-rays to help determine the seriousness of your pain. However, in many cases, more time must pass before a clear cause of the pain can be found. Before that point, your health care provider may not know if you need more testing or further treatment. HOME CARE INSTRUCTIONS Monitor your abdominal pain for any changes. The following actions may help to alleviate any discomfort you are experiencing:  Only take over-the-counter or prescription medicines as directed by your health care provider.  Do not take laxatives unless directed to do so by your health care provider.  Try a clear liquid diet (broth, tea, or water) as directed by your health care provider. Slowly move to a bland diet as tolerated. SEEK MEDICAL CARE IF:  You have unexplained abdominal pain.  You have abdominal pain associated with nausea or diarrhea.  You have pain when you urinate or have a bowel movement.  You experience abdominal pain that wakes you in the night.  You have abdominal pain that is worsened or improved by eating food.  You have abdominal pain that is worsened with eating fatty foods.  You have a fever. SEEK IMMEDIATE MEDICAL CARE IF:  Your pain does not go away within 2 hours.  You keep throwing up (vomiting).  Your pain is felt only in portions of the abdomen, such as the right side or the left lower portion of the abdomen.  You pass bloody or black tarry stools. MAKE SURE YOU:  Understand these instructions.  Will watch your condition.  Will get help right away if you are not doing well or get worse.   This information is not intended to replace advice given to you by your health care provider. Make sure you discuss  any questions you have with your health care provider.   Document Released: 06/09/2005 Document Revised: 05/21/2015 Document Reviewed: 05/09/2013 Elsevier Interactive Patient Education 2016 [employer]  Nausea and Vomiting Nausea means you feel sick to your stomach. Throwing up (vomiting) is a reflex where stomach contents come out of your mouth. HOME CARE   Take medicine as told by your doctor.  Do not force yourself to eat. However, you do need to drink fluids.  If you feel like eating, eat a normal diet as told by your doctor.  Eat rice, wheat, potatoes, bread, lean meats, yogurt, fruits, and vegetables.  Avoid high-fat foods.  Drink enough fluids to keep your pee (urine) clear or pale yellow.  Ask your doctor how to replace body fluid losses (rehydrate). Signs of body fluid loss (dehydration) include:  Feeling very thirsty.  Dry lips and mouth.  Feeling dizzy.  Dark pee.  Peeing less than normal.  Feeling confused.  Fast breathing or heart rate. GET HELP RIGHT AWAY IF:   You have blood in your throw up.  You have black or bloody poop (stool).  You have a bad headache or stiff neck.  You feel confused.  You have bad belly (abdominal) pain.  You have chest pain or trouble breathing.  You do not pee at least once every 8 hours.  You have cold, clammy skin.  You keep throwing up after 24 to 48 hours.  You have a fever. MAKE SURE YOU:  Understand these instructions.  Will watch your condition.  Will get help right away if you are not doing well or get worse.   This information is not intended to replace advice given to you by your health care provider. Make sure you discuss any questions you have with your health care provider.   Document Released: 02/16/2008 Document Revised: 11/22/2011 Document Reviewed: 01/29/2011 Elsevier Interactive Patient Education [employer].

## 2015-11-07 NOTE — ED Provider Notes (Signed)
[facility] Emergency Department Provider Note  ____________________________________________  Time seen: Approximately 340 AM  I have reviewed the triage vital signs and the nursing notes.   HISTORY  Chief Complaint Emesis    HPI Fernando Velasquez is a 25 y.o. male with a history of leukemia in remission since age 35 who is presenting with nausea and vomiting as well as abdominal cramping. He says that he started to feel ill about 24 hours ago. He said that he had a fever to 101 earlier today. Denies any body aches. Denies any known sick contacts. Says that his abdominal pain has been diffuse and cramping. Denies any diarrhea.Admits to cough but no runny nose. Patient denies any drinking or drug use.   Past Medical History  Diagnosis Date  . Leukemia ([facility]) 20 years ago    Remission age 69    There are no active problems to display for this patient.   History reviewed. No pertinent past surgical history.  Current Outpatient Rx  Name  Route  Sig  Dispense  Refill  . cyclobenzaprine (FLEXERIL) 5 MG tablet   Oral   Take 2 tablets (10 mg total) by mouth 3 (three) times daily as needed for muscle spasms.   30 tablet   0   . meloxicam (MOBIC) 15 MG tablet   Oral   Take 1 tablet (15 mg total) by mouth daily.   15 tablet   0     Allergies Review of patient's allergies indicates no known allergies.  No family history on file.  Social History Social History  Substance Use Topics  . Smoking status: Never Smoker   . Smokeless tobacco: None  . Alcohol Use: 0.0 oz/week    0 Standard drinks or equivalent per week     Comment: socially    Review of Systems Constitutional: Positive for fever earlier today. Eyes: No visual changes. ENT: No sore throat. Cardiovascular: Denies chest pain. Respiratory: Denies shortness of breath. Gastrointestinal:  No diarrhea.  No constipation. Genitourinary: Negative for dysuria. Musculoskeletal: Negative for  back pain. Skin: Negative for rash. Neurological: Negative for headaches, focal weakness or numbness.  10-point ROS otherwise negative.  ____________________________________________   PHYSICAL EXAM:  VITAL SIGNS: ED Triage Vitals  Enc Vitals Group     BP 11/07/15 0006 123/78 mmHg     Pulse Rate 11/07/15 0006 84     Resp 11/07/15 0006 18     Temp 11/07/15 0006 98.3 F (36.8 C)     Temp Source 11/07/15 0006 Oral     SpO2 11/07/15 0006 96 %     Weight 11/07/15 0006 260 lb (117.935 kg)     Height 11/07/15 0006 6\' 4"  (1.93 m)     Head Cir --      Peak Flow --      Pain Score 11/07/15 0007 0     Pain Loc --      Pain Edu? --      Excl. in [place]? --     Constitutional: Alert and oriented. Well appearing and in no acute distress. Eyes: Conjunctivae are normal. PERRL. EOMI. Head: Atraumatic. Nose: No congestion/rhinnorhea. Mouth/Throat: Mucous membranes are moist.   Neck: No stridor.   Cardiovascular: Normal rate, regular rhythm. Grossly normal heart sounds.  Good peripheral circulation. Respiratory: Normal respiratory effort.  No retractions. Lungs CTAB. Gastrointestinal: Soft and nontender diffusely but says feels "sore diffusely." No distention.  No CVA tenderness. Musculoskeletal: No lower extremity tenderness nor edema.  No joint effusions. Neurologic:  Normal speech and language. No gross focal neurologic deficits are appreciated. No gait instability. Skin:  Skin is warm, dry and intact. No rash noted. Psychiatric: Mood and affect are normal. Speech and behavior are normal.  ____________________________________________   LABS (all labs ordered are listed, but only abnormal results are displayed)  Labs Reviewed  COMPREHENSIVE METABOLIC PANEL - Abnormal; Notable for the following:    Calcium 8.7 (*)    AST 45 (*)    ALT 64 (*)    Total Bilirubin 1.3 (*)    All other components within normal limits  CBC - Abnormal; Notable for the following:    WBC 11.3 (*)    All  other components within normal limits  LIPASE, BLOOD  URINALYSIS COMPLETEWITH MICROSCOPIC ([facility] ONLY)   ____________________________________________  EKG   ____________________________________________  RADIOLOGY  CAT scan without any acute findings in the abdomen and pelvis. ____________________________________________   PROCEDURES    ____________________________________________   INITIAL IMPRESSION / ASSESSMENT AND PLAN / ED COURSE  Pertinent labs & imaging results that were available during my care of the patient were reviewed by me and considered in my medical decision making (see chart for details).  ----------------------------------------- 6:12 AM on 11/07/2015 -----------------------------------------  Patient without any further episodes of nausea and vomiting after Zofran. Was able to tolerate contrast. Likely viral cause. Reviewed the CAT scan results with the patient and he is understanding of plan for discharge. He does not a primary care doctor.  ____________________________________________   FINAL CLINICAL IMPRESSION(S) / ED DIAGNOSES  Abdominal pain. Nausea and vomiting.    Orbie Pyo, MD 11/07/15 [phone]

## 2016-11-09 IMAGING — CT CT ABD-PELV W/ CM
2 of 3 series · 15 of 42 positions shown, 19 images · IV contrast (omnipaque)
Comparison: Lumbar spine radiographs performed 02/18/2014

CLINICAL DATA: Acute onset of nausea and vomiting. Body aches and
fever. Initial encounter.

EXAM:
CT ABDOMEN AND PELVIS WITH CONTRAST
TECHNIQUE: Multidetector CT imaging of the abdomen and pelvis was performed
using the standard protocol following bolus administration of
intravenous contrast.
CONTRAST:  125mL OMNIPAQUE IOHEXOL 300 MG/ML  SOLN

[Series 2: routine abd pel with · axial · 0.84mm/px · z∈[-994,-479]mm · 12 of 118 slices shown, 16 images]
[im 10/118  soft-tissue]
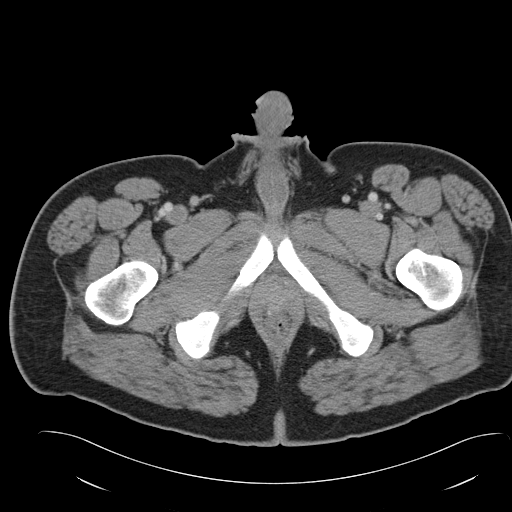
[im 10/118  bone]
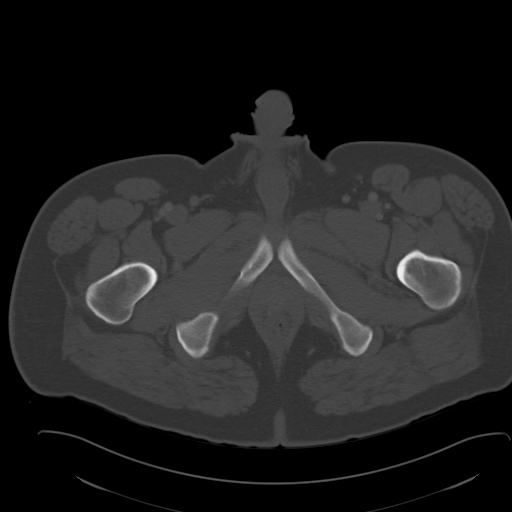
[im 19/118  soft-tissue]
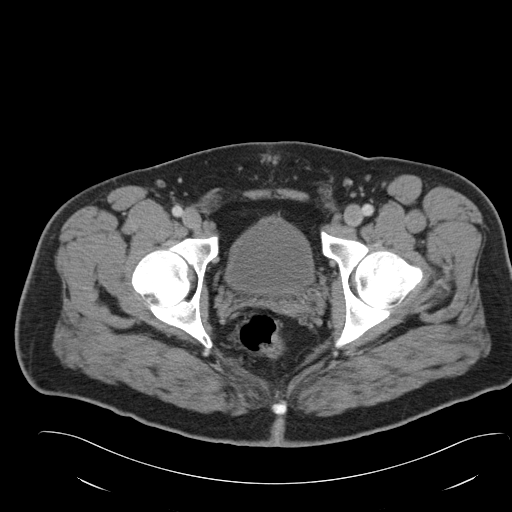
[im 33/118  soft-tissue]
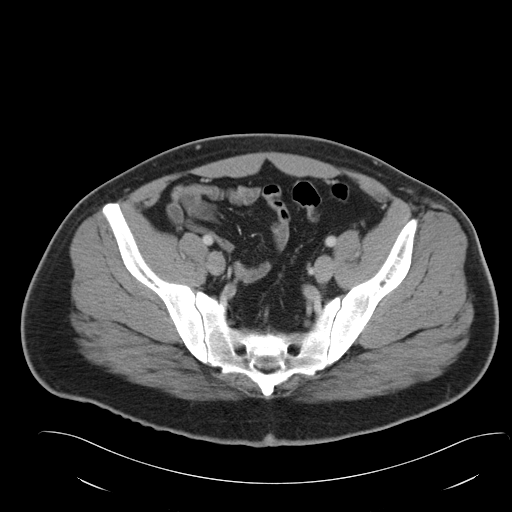
[im 43/118  soft-tissue]
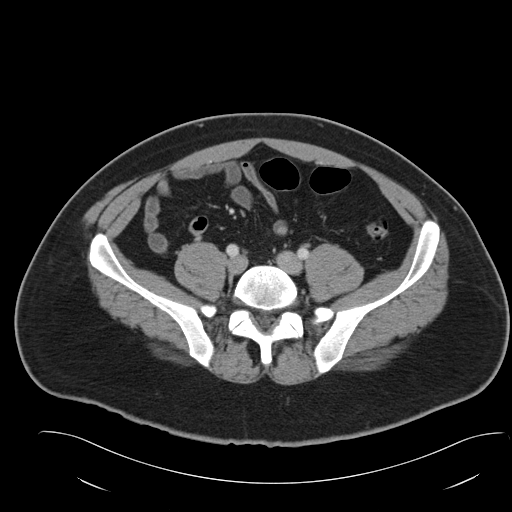
[im 52/118  soft-tissue]
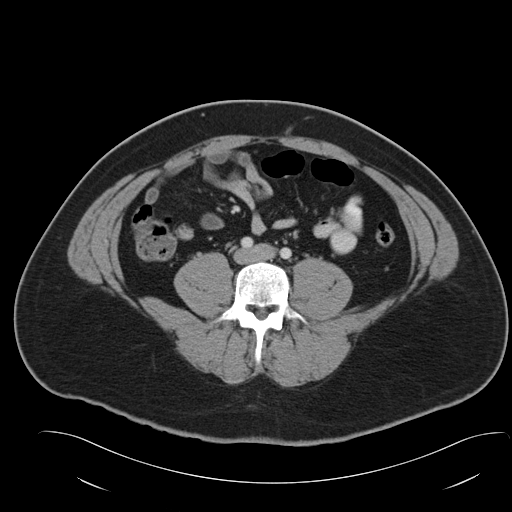
[im 66/118  soft-tissue]
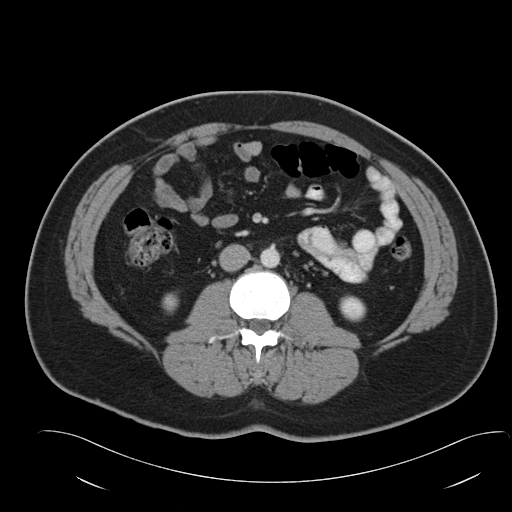
[im 75/118  soft-tissue]
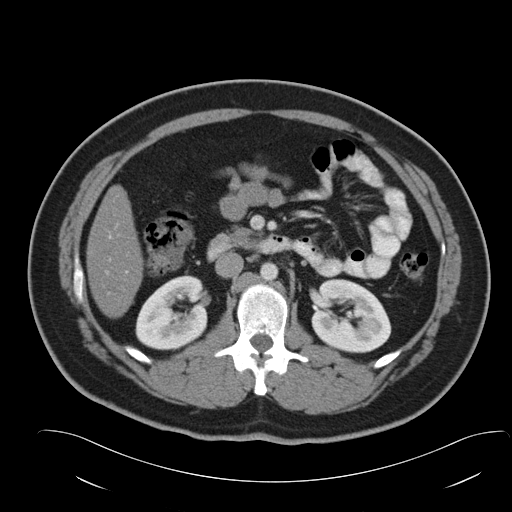
[im 89/118  soft-tissue]
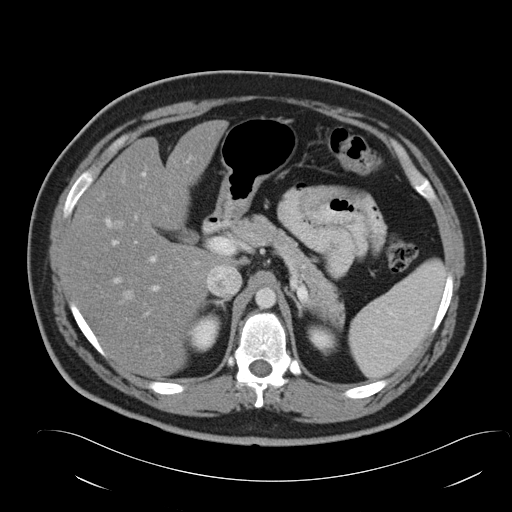
[im 99/118  soft-tissue]
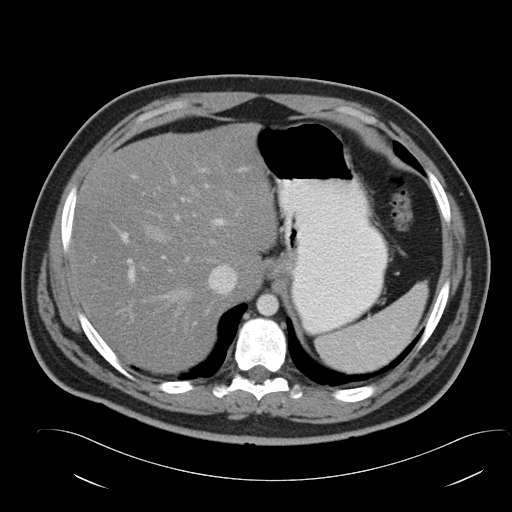
[im 99/118  lung]
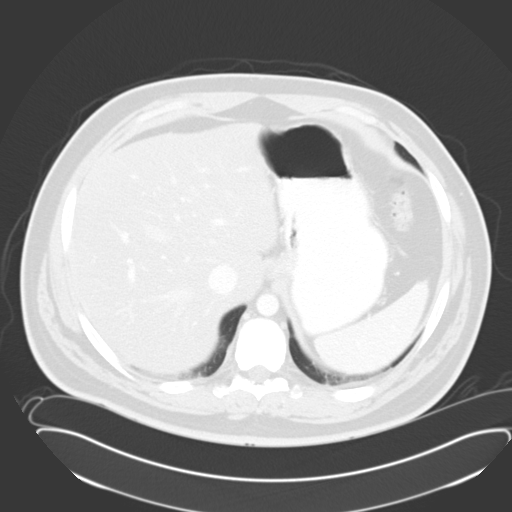
[im 99/118  bone]
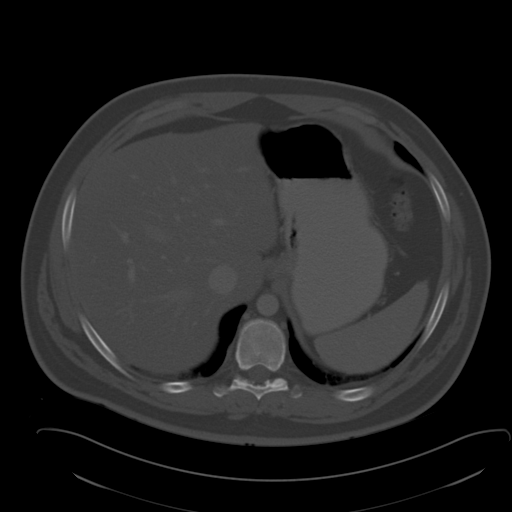
[im 103/118  lung]
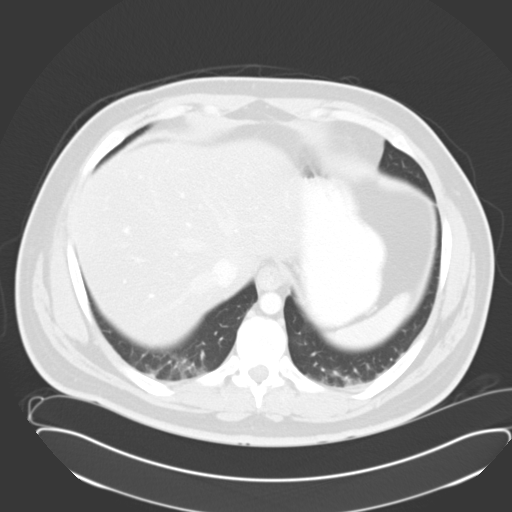
[im 108/118  soft-tissue]
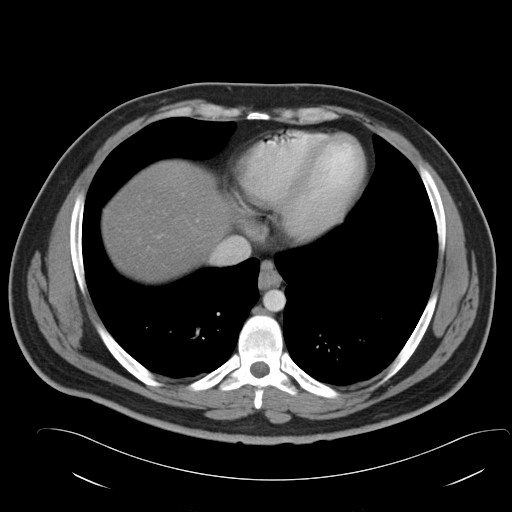
[im 108/118  lung]
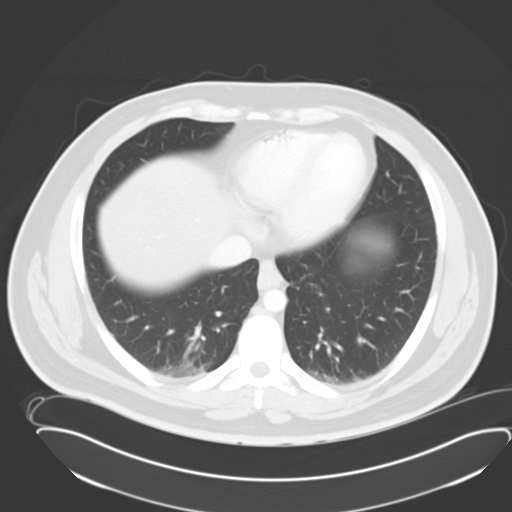
[im 113/118  lung]
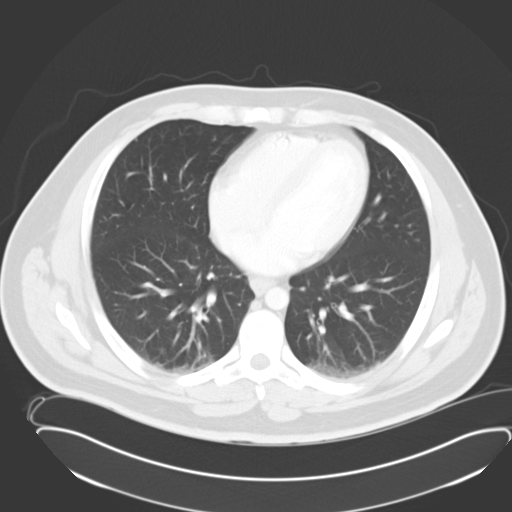

[Series 5: cor routine abd pel with · coronal · 1.17mm/px · 3 of 153 slices shown]
[im 51/153  soft-tissue]
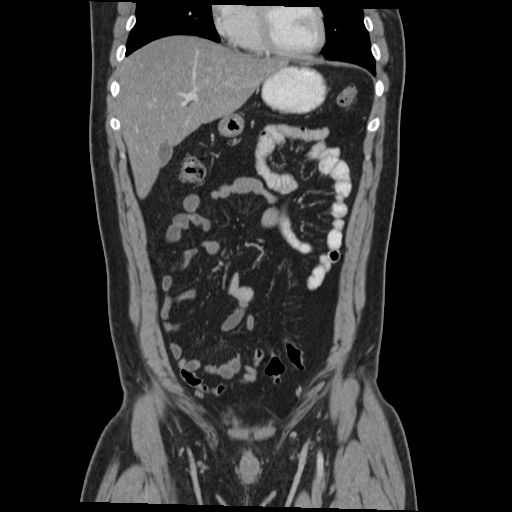
[im 68/153  soft-tissue]
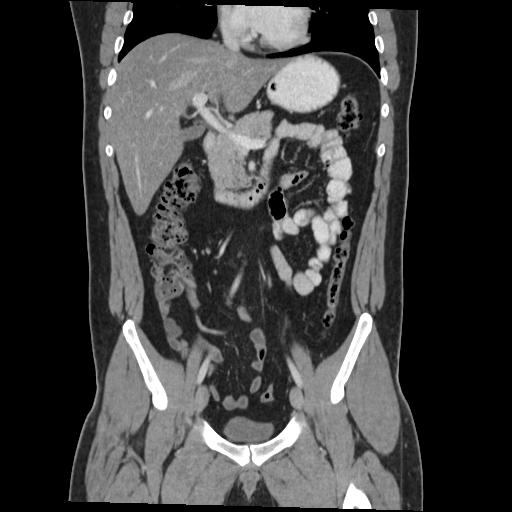
[im 85/153  soft-tissue]
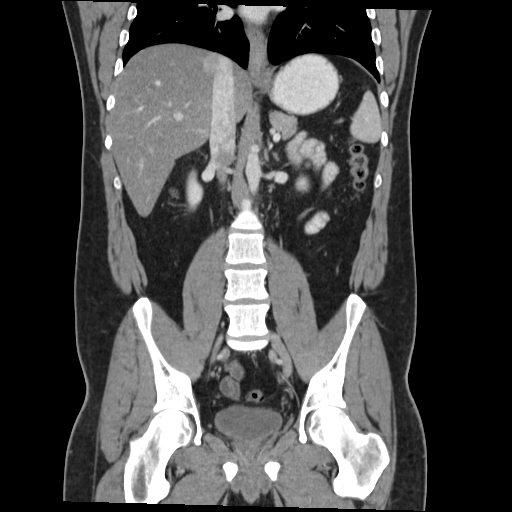

[15 of 42 positions shown; findings below may reference images not displayed]

FINDINGS: Minimal bibasilar atelectasis is noted.

The liver and spleen are unremarkable in appearance. The gallbladder
is within normal limits. The pancreas and adrenal glands are
unremarkable.

The kidneys are unremarkable in appearance. There is no evidence of
hydronephrosis. No renal or ureteral stones are seen. No perinephric
stranding is appreciated.

No free fluid is identified. The small bowel is unremarkable in
appearance. The stomach is within normal limits. No acute vascular
abnormalities are seen.

The appendix is normal in caliber, without evidence of appendicitis.
The colon is unremarkable in appearance.

The bladder is mildly distended. A small urachal remnant is
incidentally noted. The prostate remains normal in size. No inguinal
lymphadenopathy is seen.

No acute osseous abnormalities are identified.
IMPRESSION: No acute abnormality seen within the abdomen or pelvis.

## 2019-04-24 ENCOUNTER — Ambulatory Visit
Admission: EM | Admit: 2019-04-24 | Discharge: 2019-04-24 | Disposition: A | Discharge status: Home / Self Care | Payer: No Typology Code available for payment source | Attending: Family Medicine | Admitting: Family Medicine

## 2019-04-24 ENCOUNTER — Other Ambulatory Visit: Payer: Self-pay

## 2019-04-24 ENCOUNTER — Encounter: Payer: Self-pay | Admitting: Emergency Medicine

## 2019-04-24 DIAGNOSIS — S29012A Strain of muscle and tendon of back wall of thorax, initial encounter: Secondary | ICD-10-CM

## 2019-04-24 DIAGNOSIS — T148XXA Other injury of unspecified body region, initial encounter: Secondary | ICD-10-CM

## 2019-04-24 DIAGNOSIS — X500XXA Overexertion from strenuous movement or load, initial encounter: Secondary | ICD-10-CM

## 2019-04-24 MED ORDER — MELOXICAM 15 MG PO TABS: 15.0000 mg | ORAL_TABLET | Freq: Every day | ORAL | 0 refills | Status: AC | PRN

## 2019-04-24 MED ORDER — TIZANIDINE HCL 4 MG PO TABS: 4.0000 mg | ORAL_TABLET | Freq: Three times a day (TID) | ORAL | 0 refills | Status: AC | PRN

## 2019-04-24 NOTE — ED Triage Notes (Addendum)
Patient states on Saturday he was lifting something at work and he started having sharp pain on the left side of his rib area. Pain has continued.  Date of Injury: 04/21/19 Workers comp

## 2019-04-24 NOTE — ED Provider Notes (Signed)
[facility]    CSN: [id number] Arrival date & time: 04/24/19  1051  History   Chief Complaint Chief Complaint  Patient presents with  . Muscle Pain    APPT  . workers comp   HPI  28 year old male presents with the above complaints.  Patient reports that he injured himself on Saturday at work.  He was lifting a large engine and subsequently developed pain around his right lower ribs/back.  Pain is sharp.  Worse with activity and lifting.  Patient states he was unable to bend over and lift light objects at work due to the pain.  No medications or interventions tried.  Better with rest.  No other associated symptoms.  No other complaints.  History reviewed and updated as below.  Past Medical History:  Diagnosis Date  . 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 No pertinent surgical history.   Home Medications    Prior to Admission medications   Medication Sig Start Date End Date Taking? Authorizing Provider  meloxicam (MOBIC) 15 MG tablet Take 1 tablet (15 mg total) by mouth daily as needed. 04/24/19   Coral Spikes, DO  tiZANidine (ZANAFLEX) 4 MG tablet Take 1 tablet (4 mg total) by mouth every 8 (eight) hours as needed for muscle spasms. 04/24/19   Coral Spikes, DO   Social History Social History   Tobacco Use  . Smoking status: Never Smoker  . Smokeless tobacco: Never Used  Substance Use Topics  . Alcohol use: Yes    Alcohol/week: 0.0 standard drinks    Comment: socially  . Drug use: Not on file    Allergies   Patient has no known allergies.   Review of Systems Review of Systems  Constitutional: Negative.   Musculoskeletal:       Pain, ribs/back.   Physical Exam Triage Vital Signs ED Triage Vitals  Enc Vitals Group     BP 04/24/19 1106 129/85     Pulse Rate 04/24/19 1106 69     Resp 04/24/19 1106 18     Temp 04/24/19 1106 98.2 F (36.8 C)     Temp Source 04/24/19 1106 Oral     SpO2 04/24/19 1106 100 %     Weight  04/24/19 1107 263 lb (119.3 kg)     Height 04/24/19 1107 6\' 4"  (1.93 m)     Head Circumference --      Peak Flow --      Pain Score 04/24/19 1107 8     Pain Loc --      Pain Edu? --      Excl. in [place]? --    No data found.  Updated Vital Signs BP 129/85 (BP Location: Right Arm)   Pulse 69   Temp 98.2 F (36.8 C) (Oral)   Resp 18   Ht 6\' 4"  (1.93 m)   Wt 119.3 kg   SpO2 100%   BMI 32.01 kg/m   Visual Acuity Right Eye Distance:   Left Eye Distance:   Bilateral Distance:    Right Eye Near:   Left Eye Near:    Bilateral Near:     Physical Exam Constitutional:      General: He is not in acute distress.    Appearance: Normal appearance. He is obese.  HENT:     Head: Normocephalic and atraumatic.  Eyes:     General:        Right eye: No discharge.  Left eye: No discharge.     Conjunctiva/sclera: Conjunctivae normal.  Cardiovascular:     Rate and Rhythm: Normal rate and regular rhythm.  Pulmonary:     Effort: Pulmonary effort is normal.     Breath sounds: Normal breath sounds. No wheezing, rhonchi or rales.  Musculoskeletal:     Comments: Right flank/ribs without discrete tenderness to palpation.  Neurological:     Mental Status: He is alert.  Psychiatric:        Mood and Affect: Mood normal.        Behavior: Behavior normal.    UC Treatments / Results  Labs (all labs ordered are listed, but only abnormal results are displayed) Labs Reviewed - No data to display  EKG   Radiology No results found.  Procedures Procedures (including critical care time)  Medications Ordered in UC Medications - No data to display  Initial Impression / Assessment and Plan / UC Course  I have reviewed the triage vital signs and the nursing notes.  Pertinent labs & imaging results that were available during my care of the patient were reviewed by me and considered in my medical decision making (see chart for details).    28 year old male presents with a muscle  strain.  Treating with meloxicam and Zanaflex.  Workmen's Comp. form filled out.  Final Clinical Impressions(s) / UC Diagnoses   Final diagnoses:  Muscle strain     Discharge Instructions     Rest.  Medication as prescribed.  Take care  Dr. Lacinda Axon    ED Prescriptions    Medication Sig Dispense Auth. Provider   meloxicam (MOBIC) 15 MG tablet Take 1 tablet (15 mg total) by mouth daily as needed. 30 tablet Dandra Shambaugh G, DO   tiZANidine (ZANAFLEX) 4 MG tablet Take 1 tablet (4 mg total) by mouth every 8 (eight) hours as needed for muscle spasms. 30 tablet Coral Spikes, DO     Controlled Substance Prescriptions [place] Controlled Substance Registry consulted? Not Applicable   Coral Spikes, DO 04/24/19 1323

## 2019-04-24 NOTE — Discharge Instructions (Signed)
Rest  Medication as prescribed.  Take care  Dr. Jennifermarie Franzen  

## 2019-04-30 ENCOUNTER — Ambulatory Visit
Admission: EM | Admit: 2019-04-30 | Discharge: 2019-04-30 | Disposition: A | Discharge status: Other Acute Inpatient Hospital | Payer: No Typology Code available for payment source

## 2019-04-30 ENCOUNTER — Other Ambulatory Visit: Payer: Self-pay

## 2019-11-25 ENCOUNTER — Other Ambulatory Visit: Payer: Self-pay

## 2019-11-25 ENCOUNTER — Ambulatory Visit
Admission: EM | Admit: 2019-11-25 | Discharge: 2019-11-25 | Disposition: A | Discharge status: Home / Self Care | Payer: Worker's Compensation | Attending: Orthopedic Surgery | Admitting: Orthopedic Surgery

## 2019-11-25 ENCOUNTER — Encounter: Payer: Self-pay | Admitting: Emergency Medicine

## 2019-11-25 DIAGNOSIS — M5416 Radiculopathy, lumbar region: Secondary | ICD-10-CM

## 2019-11-25 DIAGNOSIS — S39012A Strain of muscle, fascia and tendon of lower back, initial encounter: Secondary | ICD-10-CM

## 2019-11-25 DIAGNOSIS — X500XXA Overexertion from strenuous movement or load, initial encounter: Secondary | ICD-10-CM

## 2019-11-25 MED ORDER — CYCLOBENZAPRINE HCL 5 MG PO TABS: 5.0000 mg | ORAL_TABLET | Freq: Three times a day (TID) | ORAL | 0 refills | Status: AC | PRN

## 2019-11-25 MED ORDER — PREDNISONE 10 MG PO TABS: 10.0000 mg | ORAL_TABLET | Freq: Every day | ORAL | 0 refills | Status: DC

## 2019-11-25 MED ORDER — HYDROCODONE-ACETAMINOPHEN 5-325 MG PO TABS: 1.0000 | ORAL_TABLET | ORAL | 0 refills | Status: AC | PRN

## 2019-11-25 NOTE — ED Provider Notes (Signed)
[facility]    CSN: [id number] Arrival date & time: 11/25/19  0802      History   Chief Complaint Chief Complaint  Patient presents with  . Back Pain  . Worker's Comp Injury    HPI Fernando Velasquez is a 29 y.o. male presents to the urgent care facility for evaluation of severe low back pain.  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  Patient states he bent over to move a wheel and noticed his back seized up.  He had a hard time standing up.  With certain movements would feel some pain shooting down his left leg.  He has been taking Tylenol and ibuprofen every 6 hours with only mild relief.  Has been trying to perform some home stretching exercises.  Denies any weakness or loss of bowel or bladder symptoms.  His pain is 8 out of 10 and worse with movement.  When he lifts his left leg he has pain going down his left leg.  No history of back problems.  No fevers.  No loss of bowel or bladder symptoms.  No urinary symptoms.  HPI  Past Medical History:  Diagnosis Date  . 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   History reviewed. No pertinent surgical history.     Home Medications    Prior to Admission medications   Medication Sig Start Date End Date Taking? Authorizing Provider  cyclobenzaprine (FLEXERIL) 5 MG tablet Take 1-2 tablets (5-10 mg total) by mouth 3 (three) times daily as needed for muscle spasms. 11/25/19   Duanne Guess, PA-C  HYDROcodone-acetaminophen (NORCO) 5-325 MG tablet Take 1 tablet by mouth every 4 (four) hours as needed for moderate pain. 11/25/19   Duanne Guess, PA-C  meloxicam (MOBIC) 15 MG tablet Take 1 tablet (15 mg total) by mouth daily as needed. 04/24/19   Coral Spikes, DO  predniSONE (DELTASONE) 10 MG tablet Take 1 tablet (10 mg total) by mouth daily. 6,5,4,3,2,1 six  day taper 11/25/19   Duanne Guess, PA-C  tiZANidine (ZANAFLEX) 4 MG tablet Take 1 tablet (4 mg total) by mouth every 8 (eight) hours as needed for muscle spasms. 04/24/19   Coral Spikes, DO    Family History Family History  Problem Relation Age of Onset  . Healthy Mother   . Healthy Father     Social History Social History   Tobacco Use  . Smoking status: Never Smoker  . Smokeless tobacco: Never Used  Substance Use Topics  . Alcohol use: Yes    Alcohol/week: 0.0 standard drinks    Comment: socially  . Drug use: Not on file     Allergies   Patient has no known allergies.   Review of Systems Review of Systems  Constitutional: Negative for fever.  Respiratory: Negative for shortness of breath.   Cardiovascular: Negative for chest pain.  Gastrointestinal: Negative for abdominal pain.  Genitourinary: Negative for difficulty urinating, dysuria and urgency.  Musculoskeletal: Positive for back pain and myalgias.  Skin: Negative for rash.  Neurological: Positive for numbness. Negative for dizziness and headaches.     Physical Exam Triage Vital Signs ED Triage Vitals  Enc Vitals Group     BP 11/25/19 0818 129/81     Pulse Rate 11/25/19 0818 74  Resp 11/25/19 0818 16     Temp 11/25/19 0818 98.2 F (36.8 C)     Temp Source 11/25/19 0818 Oral     SpO2 11/25/19 0818 100 %     Weight 11/25/19 0816 270 lb (122.5 kg)     Height 11/25/19 0816 6\' 4"  (1.93 m)     Head Circumference --      Peak Flow --      Pain Score 11/25/19 0816 8     Pain Loc --      Pain Edu? --      Excl. in [place]? --    No data found.  Updated Vital Signs BP 129/81 (BP Location: Left Arm)   Pulse 74   Temp 98.2 F (36.8 C) (Oral)   Resp 16   Ht 6\' 4"  (1.93 m)   Wt 270 lb (122.5 kg)   SpO2 100%   BMI 32.87 kg/m   Visual Acuity Right Eye Distance:   Left Eye Distance:   Bilateral Distance:    Right Eye Near:   Left Eye Near:    Bilateral Near:     Physical Exam Constitutional:       Appearance: He is well-developed.  HENT:     Head: Normocephalic and atraumatic.  Eyes:     Conjunctiva/sclera: Conjunctivae normal.  Cardiovascular:     Rate and Rhythm: Normal rate.  Pulmonary:     Effort: Pulmonary effort is normal. No respiratory distress.  Musculoskeletal:     Cervical back: Normal range of motion.     Comments: Lumbar Spine: Examination of the lumbar spine reveals no bony abnormality, no edema, and no ecchymosis.  There is no step off.  The patient has decreased range of motion of the lumbar spine with flexion and extension.  The patient has decreased lateral bend and rotation.  The patient has pain with flexion and extension.  The patient has a negative axial load test, and a negative rotational Waddell test.  The patient is non tender along the spinous process.  Tender along the left lower lumbar paravertebral muscles.  The patient is non tender along the iliac crest.  The patient is non tender in the sciatic notch.  The patient is non tender along the Sacroiliac joint.  There is no Coccyx joint tenderness.    Bilateral Lower Extremities: Examination of the lower extremities reveals no bony abnormality, no edema, and no ecchymosis.  The patient has full active and passive range of motion of the hips, knees, and ankles.  There is no discomfort with range of motion exercises.  The patient is non tender along the greater trochanter region.  The patient has a negative Bevelyn Buckles' test bilaterally.  There is normal skin warmth.  There is normal capillary refill bilaterally.    Neurologic: The patient has a positive left straight leg raise.  The patient has normal muscle strength testing for the quadriceps, calves, ankle dorsiflexion, ankle plantarflexion, and extensor hallicus longus.  The patient has sensation that is intact to light touch.  No clonus noted bilaterally.  Patellar tendon reflexes normal bilaterally.   Skin:    General: Skin is warm.     Findings: No rash.    Neurological:     Mental Status: He is alert and oriented to person, place, and time.  Psychiatric:        Behavior: Behavior normal.        Thought Content: Thought content normal.      UC Treatments /  Results  Labs (all labs ordered are listed, but only abnormal results are displayed) Labs Reviewed - No data to display  EKG   Radiology No results found.  Procedures Procedures (including critical care time)  Medications Ordered in UC Medications - No data to display  Initial Impression / Assessment and Plan / UC Course  I have reviewed the triage vital signs and the nursing notes.  Pertinent labs & imaging results that were available during my care of the patient were reviewed by me and considered in my medical decision making (see chart for details).     29 year old male with acute low back pain 2 days ago at work loading is a truck.  Was lifting a relatively light object, as he was bent over he thinks he may have twisted and his back seized up.  History and exam consistent with left lower lumbar muscle spasms with intermittent left lumbar radicular symptoms.  He has been treating over the last couple days with anti-inflammatory medications and Tylenol with little relief.  No neurological deficits today.  Vital signs are stable.  History and exam consistent with muscle spasm with left lumbar radiculopathy.  Will place on prednisone taper x6 days, Flexeril, Norco as needed at nighttime.  Patient will follow-up with orthopedics.  Patient understands signs and symptoms return to urgent care for he is given a note to remain out of work for 7 days. Final Clinical Impressions(s) / UC Diagnoses   Final diagnoses:  Strain of lumbar region, initial encounter  Left lumbar radiculopathy     Discharge Instructions     Please take medications as prescribed.  Use heating pad to the lower back.  Please work on gentle stretching exercises.  Call orthopedic office on Monday to schedule  follow-up appointment, return to the urgent care for any worsening symptoms or urgent changes in your health such as weakness, severe pain, loss of bowel or bladder symptoms.   ED Prescriptions    Medication Sig Dispense Auth. Provider   cyclobenzaprine (FLEXERIL) 5 MG tablet Take 1-2 tablets (5-10 mg total) by mouth 3 (three) times daily as needed for muscle spasms. 20 tablet Duanne Guess, PA-C   predniSONE (DELTASONE) 10 MG tablet Take 1 tablet (10 mg total) by mouth daily. 6,5,4,3,2,1 six day taper 21 tablet Duanne Guess, PA-C   HYDROcodone-acetaminophen (NORCO) 5-325 MG tablet Take 1 tablet by mouth every 4 (four) hours as needed for moderate pain. 8 tablet Duanne Guess, PA-C     I have reviewed the PDMP during this encounter.   Duanne Guess, [place] 11/25/19 [phone]

## 2019-11-25 NOTE — ED Triage Notes (Signed)
Patient c/o lower back that started Friday night at 11:57pm while at work.  Patient states that he was lifting a heavy box off a cart at that time.  Patient also reports muscle spasm.

## 2019-11-25 NOTE — Discharge Instructions (Signed)
Please take medications as prescribed.  Use heating pad to the lower back.  Please work on gentle stretching exercises.  Call orthopedic office on Monday to schedule follow-up appointment, return to the urgent care for any worsening symptoms or urgent changes in your health such as weakness, severe pain, loss of bowel or bladder symptoms.

## 2022-09-16 ENCOUNTER — Other Ambulatory Visit: Payer: Self-pay

## 2022-09-16 MED ORDER — BETAMETHASONE VALERATE 0.12 % EX FOAM
CUTANEOUS | 0 refills | Status: AC
  Filled 2022-09-16 (×2): qty 100, 14d supply, fill #0

## 2022-09-16 MED ORDER — KETOCONAZOLE 2 % EX SHAM
MEDICATED_SHAMPOO | CUTANEOUS | 2 refills | Status: AC
  Filled 2022-09-16: qty 120, 28d supply, fill #0

## 2022-09-17 ENCOUNTER — Other Ambulatory Visit: Payer: Self-pay

## 2022-09-17 MED ORDER — CLOBETASOL PROPIONATE 0.05 % EX FOAM
CUTANEOUS | 0 refills | Status: AC
  Filled 2022-09-17: qty 50, 30d supply, fill #0
  Filled 2022-09-22: qty 50, 14d supply, fill #0

## 2022-09-22 ENCOUNTER — Other Ambulatory Visit: Payer: Self-pay

## 2022-12-02 DIAGNOSIS — Z6838 Body mass index (BMI) 38.0-38.9, adult: Secondary | ICD-10-CM | POA: Diagnosis not present

## 2022-12-02 DIAGNOSIS — M25561 Pain in right knee: Secondary | ICD-10-CM | POA: Diagnosis not present

## 2022-12-02 DIAGNOSIS — M2242 Chondromalacia patellae, left knee: Secondary | ICD-10-CM | POA: Diagnosis not present

## 2023-02-08 DIAGNOSIS — K760 Fatty (change of) liver, not elsewhere classified: Secondary | ICD-10-CM | POA: Diagnosis not present

## 2023-02-08 DIAGNOSIS — K769 Liver disease, unspecified: Secondary | ICD-10-CM | POA: Diagnosis not present

## 2023-02-08 DIAGNOSIS — Z859 Personal history of malignant neoplasm, unspecified: Secondary | ICD-10-CM | POA: Diagnosis not present

## 2023-02-08 DIAGNOSIS — K7689 Other specified diseases of liver: Secondary | ICD-10-CM | POA: Diagnosis not present

## 2023-04-09 ENCOUNTER — Ambulatory Visit
Admission: RE | Admit: 2023-04-09 | Discharge: 2023-04-09 | Disposition: A | Discharge status: Home / Self Care | Payer: 59 | Source: Ambulatory Visit | Attending: Emergency Medicine | Admitting: Emergency Medicine

## 2023-04-09 VITALS — BP 127/84 | HR 99 | Temp 98.8°F | Resp 18

## 2023-04-09 DIAGNOSIS — H60501 Unspecified acute noninfective otitis externa, right ear: Secondary | ICD-10-CM | POA: Diagnosis not present

## 2023-04-09 MED ORDER — CIPROFLOXACIN-DEXAMETHASONE 0.3-0.1 % OT SUSP: 4.0000 [drp] | Freq: Two times a day (BID) | OTIC | 0 refills | Status: AC

## 2023-04-09 NOTE — ED Triage Notes (Signed)
Patient to Urgent Care with complaints of right sided ear fullness. Reports ear is red, painful and sore to the touch. Denies any fevers. Did notice some clear drainage last night.  Symptoms started on Wednesday. Worsened last night.  No over the counter medications attempted.

## 2023-04-09 NOTE — Discharge Instructions (Addendum)
Use the ear drops as directed.  Follow up with your primary care provider if your symptoms are not improving.    

## 2023-04-09 NOTE — ED Provider Notes (Signed)
[facility]    CSN: [id number] Arrival date & time: 04/09/23  0840      History   Chief Complaint Chief Complaint  Patient presents with   Ear Fullness    Pretty sure I have an ear infection.  It's red and sore to the touch. - Entered by patient    HPI Fernando Velasquez is a 32 y.o. male.  Patient presents with right ear pain and swelling x 2 days.  His symptoms started after he got water in his ear.  No fever, sore throat, cough, shortness of breath, or other symptoms.  No treatments at home.  The history is provided by the patient and medical records.    Past Medical History:  Diagnosis Date   Leukemia ([facility]) 20 years ago   Remission age 57    There are no problems to display for this patient.   Past Surgical History:  Procedure Laterality Date   BACK SURGERY         Home Medications    Prior to Admission medications   Medication Sig Start Date End Date Taking? Authorizing Provider  ciprofloxacin-dexamethasone (CIPRODEX) OTIC suspension Place 4 drops into the right ear 2 (two) times daily for 7 days. 04/09/23 04/16/23 Yes Mickie Bail, NP  Betamethasone Valerate 0.12 % foam Apply topically 2 (two) times daily for 14 days 09/16/22     clobetasol (OLUX) 0.05 % topical foam Apply 2 (two) times daily for 14 days 09/17/22     cyclobenzaprine (FLEXERIL) 5 MG tablet Take 1-2 tablets (5-10 mg total) by mouth 3 (three) times daily as needed for muscle spasms. Patient not taking: Reported on 04/09/2023 11/25/19   Evon Slack, PA-C  HYDROcodone-acetaminophen [facility]) 5-325 MG tablet Take 1 tablet by mouth every 4 (four) hours as needed for moderate pain. Patient not taking: Reported on 04/09/2023 11/25/19   Evon Slack, PA-C  ketoconazole (NIZORAL) 2 % shampoo Apply 5 to 10 mL to wet scalp, lather, leave on 5 minutes and rinse. Apply twice weekly for 4 weeks 09/16/22     meloxicam (MOBIC) 15 MG tablet Take 1 tablet (15 mg total) by mouth daily as needed. Patient  not taking: Reported on 04/09/2023 04/24/19   Tommie Sams, DO  predniSONE (DELTASONE) 10 MG tablet Take 1 tablet (10 mg total) by mouth daily. 6,5,4,3,2,1 six day taper Patient not taking: Reported on 04/09/2023 11/25/19   Evon Slack, PA-C  tiZANidine (ZANAFLEX) 4 MG tablet Take 1 tablet (4 mg total) by mouth every 8 (eight) hours as needed for muscle spasms. Patient not taking: Reported on 04/09/2023 04/24/19   Tommie Sams, DO    Family History Family History  Problem Relation Age of Onset   Healthy Mother    Healthy Father     Social History Social History   Tobacco Use   Smoking status: Never   Smokeless tobacco: Never  Vaping Use   Vaping status: Never Used  Substance Use Topics   Alcohol use: Yes    Alcohol/week: 0.0 standard drinks of alcohol    Comment: socially   Drug use: Never     Allergies   Patient has no known allergies.   Review of Systems Review of Systems  Constitutional:  Negative for chills and fever.  HENT:  Positive for ear pain. Negative for ear discharge and sore throat.   Respiratory:  Negative for cough and shortness of breath.      Physical Exam Triage Vital  Signs ED Triage Vitals  Encounter Vitals Group     BP 04/09/23 0906 127/84     Systolic BP Percentile --      Diastolic BP Percentile --      Pulse Rate 04/09/23 0902 99     Resp 04/09/23 0902 18     Temp 04/09/23 0902 98.8 F (37.1 C)     Temp src --      SpO2 04/09/23 0902 97 %     Weight --      Height --      Head Circumference --      Peak Flow --      Pain Score 04/09/23 0903 2     Pain Loc --      Pain Education --      Exclude from Growth Chart --    No data found.  Updated Vital Signs BP 127/84   Pulse 99   Temp 98.8 F (37.1 C)   Resp 18   SpO2 97%   Visual Acuity Right Eye Distance:   Left Eye Distance:   Bilateral Distance:    Right Eye Near:   Left Eye Near:    Bilateral Near:     Physical Exam Vitals and nursing note reviewed.   Constitutional:      General: He is not in acute distress.    Appearance: He is well-developed.  HENT:     Head: Normocephalic and atraumatic.     Left Ear: Tympanic membrane and ear canal normal.     Ears:     Comments: Right ear canal tender, mildly edematous with purulent drainage.    Nose: Nose normal.     Mouth/Throat:     Mouth: Mucous membranes are moist.     Pharynx: Oropharynx is clear.  Cardiovascular:     Rate and Rhythm: Normal rate and regular rhythm.     Heart sounds: Normal heart sounds.  Pulmonary:     Effort: Pulmonary effort is normal. No respiratory distress.     Breath sounds: Normal breath sounds.  Musculoskeletal:     Cervical back: Neck supple.  Skin:    General: Skin is warm and dry.  Neurological:     Mental Status: He is alert.  Psychiatric:        Mood and Affect: Mood normal.        Behavior: Behavior normal.      UC Treatments / Results  Labs (all labs ordered are listed, but only abnormal results are displayed) Labs Reviewed - No data to display  EKG   Radiology No results found.  Procedures Procedures (including critical care time)  Medications Ordered in UC Medications - No data to display  Initial Impression / Assessment and Plan / UC Course  I have reviewed the triage vital signs and the nursing notes.  Pertinent labs & imaging results that were available during my care of the patient were reviewed by me and considered in my medical decision making (see chart for details).   Right otitis externa.  Treating with Ciprodex eardrops.  Education provided on otitis externa.  Instructed patient to follow-up with his PCP if he is not improving.  He agrees to plan of care.   Final Clinical Impressions(s) / UC Diagnoses   Final diagnoses:  Acute otitis externa of right ear, unspecified type     Discharge Instructions      Use the eardrops as directed.  Follow up with your primary care provider if your  symptoms are not  improving.        ED Prescriptions     Medication Sig Dispense Auth. Provider   ciprofloxacin-dexamethasone (CIPRODEX) OTIC suspension Place 4 drops into the right ear 2 (two) times daily for 7 days. 7.5 mL Mickie Bail, NP      PDMP not reviewed this encounter.   Mickie Bail, NP 04/09/23 [phone]

## 2023-11-21 ENCOUNTER — Other Ambulatory Visit: Payer: Self-pay

## 2023-11-21 ENCOUNTER — Encounter: Payer: Self-pay | Admitting: Emergency Medicine

## 2023-11-21 ENCOUNTER — Ambulatory Visit
Admission: EM | Admit: 2023-11-21 | Discharge: 2023-11-21 | Disposition: A | Discharge status: Home / Self Care | Payer: Self-pay | Attending: Emergency Medicine | Admitting: Emergency Medicine

## 2023-11-21 DIAGNOSIS — J069 Acute upper respiratory infection, unspecified: Secondary | ICD-10-CM

## 2023-11-21 LAB — POC COVID19/FLU A&B COMBO
Covid Antigen, POC: NEGATIVE
Influenza A Antigen, POC: NEGATIVE
Influenza B Antigen, POC: NEGATIVE

## 2023-11-21 MED ORDER — PREDNISONE 10 MG (21) PO TBPK: ORAL_TABLET | Freq: Every day | ORAL | 0 refills | Status: AC

## 2023-11-21 NOTE — ED Provider Notes (Signed)
 Fernando Velasquez    CSN: [id number] Arrival date & time: 11/21/23  1722      History   Chief Complaint Chief Complaint  Patient presents with   URI    HPI Fernando Velasquez is a 33 y.o. male.   Patient presents for evaluation of generalized bodyaches, chills, nasal congestion, sinus pressure, bilateral ear fullness and lymph node swelling beginning overnight.  Associated nausea and vomiting.  Known sick contacts at work.  Tolerating food and liquids.  Has attempted use of DayQuil and NyQuil.  Denies shortness of breath and wheezing.  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   Past Surgical History:  Procedure Laterality Date   BACK SURGERY         Home Medications    Prior to Admission medications   Medication Sig Start Date End Date Taking? Authorizing Provider  predniSONE (STERAPRED UNI-PAK 21 TAB) 10 MG (21) TBPK tablet Take by mouth daily. Take 6 tabs by mouth daily  for 1 days, then 5 tabs for 1 days, then 4 tabs for 1 days, then 3 tabs for 1 days, 2 tabs for 1 days, then 1 tab by mouth daily for 1 days 11/21/23  Yes Ajeenah Heiny, Elita Boone, NP  Betamethasone Valerate 0.12 % foam Apply topically 2 (two) times daily for 14 days 09/16/22     clobetasol (OLUX) 0.05 % topical foam Apply 2 (two) times daily for 14 days 09/17/22     cyclobenzaprine (FLEXERIL) 5 MG tablet Take 1-2 tablets (5-10 mg total) by mouth 3 (three) times daily as needed for muscle spasms. Patient not taking: Reported on 04/09/2023 11/25/19   Evon Slack, PA-C  HYDROcodone-acetaminophen [facility]) 5-325 MG tablet Take 1 tablet by mouth every 4 (four) hours as needed for moderate pain. Patient not taking: Reported on 04/09/2023 11/25/19   Evon Slack, PA-C  ketoconazole (NIZORAL) 2 % shampoo Apply 5 to 10 mL to wet scalp, lather, leave on 5 minutes and rinse. Apply twice weekly for 4 weeks  09/16/22     meloxicam (MOBIC) 15 MG tablet Take 1 tablet (15 mg total) by mouth daily as needed. Patient not taking: Reported on 04/09/2023 04/24/19   Tommie Sams, DO  tiZANidine (ZANAFLEX) 4 MG tablet Take 1 tablet (4 mg total) by mouth every 8 (eight) hours as needed for muscle spasms. Patient not taking: Reported on 04/09/2023 04/24/19   Tommie Sams, DO    Family History Family History  Problem Relation Age of Onset   Healthy Mother    Healthy Father     Social History Social History   Tobacco Use   Smoking status: Never   Smokeless tobacco: Never  Vaping Use   Vaping status: Never Used  Substance Use Topics   Alcohol use: Yes    Alcohol/week: 0.0 standard drinks of alcohol    Comment: socially   Drug use: Never     Allergies   Patient has no known allergies.   Review of Systems Review of Systems   Physical Exam Triage Vital Signs ED Triage Vitals [11/21/23 1734]  Encounter Vitals Group     BP (!) 142/93     Systolic BP Percentile      Diastolic BP Percentile      Pulse Rate (!) 107     Resp 18  Temp 98.9 F (37.2 C)     Temp Source Temporal     SpO2 96 %     Weight      Height      Head Circumference      Peak Flow      Pain Score 5     Pain Loc      Pain Education      Exclude from Growth Chart    No data found.  Updated Vital Signs BP (!) 142/93 (BP Location: Left Arm)   Pulse (!) 107   Temp 98.9 F (37.2 C) (Temporal)   Resp 18   SpO2 96%   Visual Acuity Right Eye Distance:   Left Eye Distance:   Bilateral Distance:    Right Eye Near:   Left Eye Near:    Bilateral Near:     Physical Exam Constitutional:      Appearance: Normal appearance.  HENT:     Head: Normocephalic.     Right Ear: Tympanic membrane, ear canal and external ear normal.     Left Ear: Tympanic membrane, ear canal and external ear normal.     Nose: Congestion present. No rhinorrhea.     Mouth/Throat:     Pharynx: Posterior oropharyngeal erythema present.  No oropharyngeal exudate.  Eyes:     Extraocular Movements: Extraocular movements intact.  Cardiovascular:     Rate and Rhythm: Normal rate and regular rhythm.     Pulses: Normal pulses.     Heart sounds: Normal heart sounds.  Pulmonary:     Effort: Pulmonary effort is normal.     Breath sounds: Normal breath sounds.  Musculoskeletal:     Cervical back: Normal range of motion.  Neurological:     Mental Status: He is alert and oriented to person, place, and time. Mental status is at baseline.      UC Treatments / Results  Labs (all labs ordered are listed, but only abnormal results are displayed) Labs Reviewed  POC COVID19/FLU A&B COMBO - Normal    EKG   Radiology No results found.  Procedures Procedures (including critical care time)  Medications Ordered in UC Medications - No data to display  Initial Impression / Assessment and Plan / UC Course  I have reviewed the triage vital signs and the nursing notes.  Pertinent labs & imaging results that were available during my care of the patient were reviewed by me and considered in my medical decision making (see chart for details).  Viral URI with cough  Patient is in no signs of distress nor toxic appearing.  Vital signs are stable.  Low suspicion for pneumonia, pneumothorax or bronchitis and therefore will defer imaging.  COVID and flu test negative.  Prescribed prednisone to help with sinus pressure.May use additional over-the-counter medications as needed for supportive care.  May follow-up with urgent care as needed if symptoms persist or worsen.  Note given.    Final Clinical Impressions(s) / UC Diagnoses   Final diagnoses:  Viral URI with cough     Discharge Instructions      Your symptoms today are most likely being caused by a virus and should steadily improve in time it can take up to 7 to 10 days before you truly start to see a turnaround however things will get better  COVID and flu test  negative  Begin prednisone every morning with food as directed to help reduce sinus pressure    You can take Tylenol and/or Ibuprofen  as needed for fever reduction and pain relief.   For cough: honey 1/2 to 1 teaspoon (you can dilute the honey in water or another fluid).  You can also use guaifenesin and dextromethorphan for cough. You can use a humidifier for chest congestion and cough.  If you don't have a humidifier, you can sit in the bathroom with the hot shower running.      For sore throat: try warm salt water gargles, cepacol lozenges, throat spray, warm tea or water with lemon/honey, popsicles or ice, or OTC cold relief medicine for throat discomfort.   For congestion: take a daily anti-histamine like Zyrtec, Claritin, and a oral decongestant, such as pseudoephedrine.  You can also use Flonase 1-2 sprays in each nostril daily.   It is important to stay hydrated: drink plenty of fluids (water, gatorade/powerade/pedialyte, juices, or teas) to keep your throat moisturized and help further relieve irritation/discomfort.    ED Prescriptions     Medication Sig Dispense Auth. Provider   predniSONE (STERAPRED UNI-PAK 21 TAB) 10 MG (21) TBPK tablet Take by mouth daily. Take 6 tabs by mouth daily  for 1 days, then 5 tabs for 1 days, then 4 tabs for 1 days, then 3 tabs for 1 days, 2 tabs for 1 days, then 1 tab by mouth daily for 1 days 21 tablet Haruna Rohlfs, Elita Boone, NP      PDMP not reviewed this encounter.   Valinda Hoar, NP 11/21/23 [phone]

## 2023-11-21 NOTE — ED Triage Notes (Signed)
 Patient presents to [facility] for evaluation of body aches, sinus pressure, headache, swollen neck, chills since last night.  Needs doctors note to either stay out or return to work

## 2023-11-21 NOTE — Discharge Instructions (Signed)
 Your symptoms today are most likely being caused by a virus and should steadily improve in time it can take up to 7 to 10 days before you truly start to see a turnaround however things will get better  COVID and flu test negative  Begin prednisone every morning with food as directed to help reduce sinus pressure    You can take Tylenol and/or Ibuprofen as needed for fever reduction and pain relief.   For cough: honey 1/2 to 1 teaspoon (you can dilute the honey in water or another fluid).  You can also use guaifenesin and dextromethorphan for cough. You can use a humidifier for chest congestion and cough.  If you don't have a humidifier, you can sit in the bathroom with the hot shower running.      For sore throat: try warm salt water gargles, cepacol lozenges, throat spray, warm tea or water with lemon/honey, popsicles or ice, or OTC cold relief medicine for throat discomfort.   For congestion: take a daily anti-histamine like Zyrtec, Claritin, and a oral decongestant, such as pseudoephedrine.  You can also use Flonase 1-2 sprays in each nostril daily.   It is important to stay hydrated: drink plenty of fluids (water, gatorade/powerade/pedialyte, juices, or teas) to keep your throat moisturized and help further relieve irritation/discomfort.

## 2024-05-18 ENCOUNTER — Other Ambulatory Visit (HOSPITAL_BASED_OUTPATIENT_CLINIC_OR_DEPARTMENT_OTHER): Payer: Self-pay
